# Patient Record
Sex: Male | Born: 1965 | Race: White | Hispanic: No | Marital: Married | Smoking: Former smoker
Health system: Southern US, Community
[De-identification: ages and names within clinical notes are randomized; demographics above are authoritative.]

## PROBLEM LIST (undated history)

## (undated) DIAGNOSIS — I10 Essential (primary) hypertension: Secondary | ICD-10-CM

## (undated) DIAGNOSIS — E7800 Pure hypercholesterolemia, unspecified: Secondary | ICD-10-CM

## (undated) HISTORY — PX: APPENDECTOMY: SHX54

## (undated) HISTORY — PX: KNEE ARTHROSCOPY: SUR90

---

## 2003-02-25 ENCOUNTER — Ambulatory Visit (HOSPITAL_BASED_OUTPATIENT_CLINIC_OR_DEPARTMENT_OTHER): Admission: RE | Admit: 2003-02-25 | Discharge: 2003-02-25 | Payer: Self-pay

## 2015-05-30 ENCOUNTER — Other Ambulatory Visit: Payer: Self-pay | Admitting: Internal Medicine

## 2015-05-30 ENCOUNTER — Inpatient Hospital Stay
Admission: RE | Admit: 2015-05-30 | Discharge: 2015-05-30 | Disposition: A | Discharge status: Home / Self Care | Payer: Self-pay | Source: Ambulatory Visit | Attending: Internal Medicine | Admitting: Internal Medicine

## 2015-05-30 DIAGNOSIS — R109 Unspecified abdominal pain: Secondary | ICD-10-CM

## 2016-04-04 DIAGNOSIS — E784 Other hyperlipidemia: Secondary | ICD-10-CM | POA: Diagnosis not present

## 2016-04-04 DIAGNOSIS — Z125 Encounter for screening for malignant neoplasm of prostate: Secondary | ICD-10-CM | POA: Diagnosis not present

## 2016-04-04 DIAGNOSIS — R7301 Impaired fasting glucose: Secondary | ICD-10-CM | POA: Diagnosis not present

## 2016-04-04 DIAGNOSIS — I10 Essential (primary) hypertension: Secondary | ICD-10-CM | POA: Diagnosis not present

## 2016-04-11 DIAGNOSIS — K219 Gastro-esophageal reflux disease without esophagitis: Secondary | ICD-10-CM | POA: Diagnosis not present

## 2016-04-11 DIAGNOSIS — Z0000 Encounter for general adult medical examination without abnormal findings: Secondary | ICD-10-CM | POA: Diagnosis not present

## 2016-04-11 DIAGNOSIS — E784 Other hyperlipidemia: Secondary | ICD-10-CM | POA: Diagnosis not present

## 2016-04-11 DIAGNOSIS — R7301 Impaired fasting glucose: Secondary | ICD-10-CM | POA: Diagnosis not present

## 2016-04-11 DIAGNOSIS — Z23 Encounter for immunization: Secondary | ICD-10-CM | POA: Diagnosis not present

## 2016-04-11 DIAGNOSIS — Z1389 Encounter for screening for other disorder: Secondary | ICD-10-CM | POA: Diagnosis not present

## 2016-04-11 DIAGNOSIS — J309 Allergic rhinitis, unspecified: Secondary | ICD-10-CM | POA: Diagnosis not present

## 2016-05-14 DIAGNOSIS — J0100 Acute maxillary sinusitis, unspecified: Secondary | ICD-10-CM | POA: Diagnosis not present

## 2016-06-25 ENCOUNTER — Encounter (HOSPITAL_COMMUNITY): Payer: Self-pay

## 2016-06-25 ENCOUNTER — Emergency Department (HOSPITAL_COMMUNITY): Payer: BLUE CROSS/BLUE SHIELD

## 2016-06-25 DIAGNOSIS — R002 Palpitations: Secondary | ICD-10-CM | POA: Diagnosis not present

## 2016-06-25 DIAGNOSIS — R55 Syncope and collapse: Secondary | ICD-10-CM | POA: Diagnosis not present

## 2016-06-25 DIAGNOSIS — Z87891 Personal history of nicotine dependence: Secondary | ICD-10-CM | POA: Insufficient documentation

## 2016-06-25 DIAGNOSIS — I10 Essential (primary) hypertension: Secondary | ICD-10-CM | POA: Insufficient documentation

## 2016-06-25 DIAGNOSIS — R0602 Shortness of breath: Secondary | ICD-10-CM | POA: Diagnosis not present

## 2016-06-25 LAB — BASIC METABOLIC PANEL
Anion gap: 14 (ref 5–15)
BUN: 13 mg/dL (ref 6–20)
CO2: 22 mmol/L (ref 22–32)
Calcium: 9.5 mg/dL (ref 8.9–10.3)
Chloride: 104 mmol/L (ref 101–111)
Creatinine, Ser: 0.95 mg/dL (ref 0.61–1.24)
GFR calc Af Amer: >60 mL/min (ref >60)
GLUCOSE: 116 mg/dL — AB (ref 65–99)
POTASSIUM: 4.3 mmol/L (ref 3.5–5.1)
Sodium: 140 mmol/L (ref 135–145)

## 2016-06-25 LAB — CBC
HEMATOCRIT: 44.1 % (ref 39.0–52.0)
Hemoglobin: 15.3 g/dL (ref 13.0–17.0)
MCH: 30.2 pg (ref 26.0–34.0)
MCHC: 34.7 g/dL (ref 30.0–36.0)
MCV: 87 fL (ref 78.0–100.0)
Platelets: 227 10*3/uL (ref 150–400)
RBC: 5.07 MIL/uL (ref 4.22–5.81)
RDW: 13.5 % (ref 11.5–15.5)
WBC: 12.7 10*3/uL — ABNORMAL HIGH (ref 4.0–10.5)

## 2016-06-25 LAB — I-STAT TROPONIN, ED: Troponin i, poc: 0 ng/mL (ref 0.00–0.08)

## 2016-06-25 NOTE — ED Triage Notes (Signed)
Onset 4 days ago pt had episode of chest pain, dizziness, and nausea.  Has happened 4 times since onset.  Symptoms tonight lasted longer than other times.  No chest pain or dizziness at this time.  Does have nausea.

## 2016-06-26 ENCOUNTER — Emergency Department (HOSPITAL_COMMUNITY)
Admission: EM | Admit: 2016-06-26 | Discharge: 2016-06-26 | Disposition: A | Discharge status: Home / Self Care | Payer: BLUE CROSS/BLUE SHIELD | Attending: Emergency Medicine | Admitting: Emergency Medicine

## 2016-06-26 DIAGNOSIS — R002 Palpitations: Secondary | ICD-10-CM

## 2016-06-26 DIAGNOSIS — R55 Syncope and collapse: Secondary | ICD-10-CM

## 2016-06-26 HISTORY — DX: Essential (primary) hypertension: I10

## 2016-06-26 HISTORY — DX: Pure hypercholesterolemia, unspecified: E78.00

## 2016-06-26 LAB — I-STAT TROPONIN, ED: TROPONIN I, POC: 0 ng/mL (ref 0.00–0.08)

## 2016-06-26 NOTE — Discharge Instructions (Signed)
You were seen today for palpitations and episodes of feeling like you're going pass out. Your workup is reassuring. You need to follow-up closely with cardiology. You likely need a Holter monitor and/or further cardiology workup. If your symptoms worsen, you pass out, you have chest pain or any new or worsening symptoms she needs to be reevaluated.

## 2016-06-26 NOTE — ED Provider Notes (Signed)
[facility]-EMERGENCY DEPT Provider Note   CSN: [id number] Arrival date & time: 06/25/16  2144     History   Chief Complaint Chief Complaint  Patient presents with  . Chest Pain    HPI Riley Rojas is a 51 y.o. male.  HPI  This a 51 year old male who presents with palpitations. Patient reports one-week history of intermittent palpitations.  He has 1-2 episodes per day and lasts for 5-10 minutes. Denies chest pain or shortness of breath during these episodes. He does state that he feels nauseous after the fact and feels like he might pass out. Denies any recent changes in diet or increased caffeine intake. No history of heart disease. No prior history. Denies drug use. Currently is asymptomatic.  Past Medical History:  Diagnosis Date  . Hypercholesteremia   . Hypertension     There are no active problems to display for this patient.   Past Surgical History:  Procedure Laterality Date  . APPENDECTOMY    . KNEE ARTHROSCOPY         Home Medications    Prior to Admission medications   Medication Sig Start Date End Date Taking? Authorizing Provider  hydrochlorothiazide (MICROZIDE) 12.5 MG capsule Take 12.5 mg by mouth daily.   Yes Historical Provider, MD  loratadine (CLARITIN) 10 MG tablet Take 10 mg by mouth daily.   Yes Historical Provider, MD  Multiple Vitamin (MULTIVITAMIN WITH MINERALS) TABS tablet Take 1 tablet by mouth daily.   Yes Historical Provider, MD  RAMIPRIL PO Take 1 capsule by mouth daily.   Yes Historical Provider, MD  SIMVASTATIN PO Take 1 tablet by mouth daily.   Yes Historical Provider, MD    Family History History reviewed. No pertinent family history.  Social History Social History  Substance Use Topics  . Smoking status: Former Games developermoker  . Smokeless tobacco: Never Used  . Alcohol use No     Allergies   Patient has no known allergies.   Review of Systems Review of Systems  Constitutional: Negative for fever.  Respiratory: Negative for  shortness of breath.   Cardiovascular: Positive for palpitations. Negative for chest pain and leg swelling.  Gastrointestinal: Positive for nausea. Negative for abdominal pain, diarrhea and vomiting.  Neurological:       Near syncope  All other systems reviewed and are negative.    Physical Exam Updated Vital Signs BP 137/98   Pulse 81   Temp 97.7 F (36.5 C) (Oral)   Resp 13   SpO2 98%   Physical Exam  Constitutional: He is oriented to person, place, and time. He appears well-developed and well-nourished. No distress.  HENT:  Head: Normocephalic and atraumatic.  Cardiovascular: Normal rate, regular rhythm and normal heart sounds.   No murmur heard. Pulmonary/Chest: Effort normal and breath sounds normal. No respiratory distress. He has no wheezes.  Abdominal: Soft. Bowel sounds are normal. There is no tenderness. There is no rebound.  Musculoskeletal: He exhibits no edema.  Neurological: He is alert and oriented to person, place, and time.  Skin: Skin is warm and dry.  Psychiatric: He has a normal mood and affect.  Nursing note and vitals reviewed.    ED Treatments / Results  Labs (all labs ordered are listed, but only abnormal results are displayed) Labs Reviewed  BASIC METABOLIC PANEL - Abnormal; Notable for the following:       Result Value   Glucose, Bld 116 (*)    All other components within normal limits  CBC -  Abnormal; Notable for the following:    WBC 12.7 (*)    All other components within normal limits  I-STAT TROPOININ, ED  I-STAT TROPOININ, ED    EKG  EKG Interpretation  Date/Time:  Tuesday June 25 2016 21:48:28 EST Ventricular Rate:  107 PR Interval:  172 QRS Duration: 74 QT Interval:  316 QTC Calculation: 421 R Axis:   -51 Text Interpretation:  Sinus tachycardia Left axis deviation Low voltage QRS Cannot rule out Anterior infarct , age undetermined Abnormal ECG No prior Confirmed by Wilkie Aye  MD, Daleah Coulson ([id number]) on 06/26/2016 5:20:09 AM         Radiology Dg Chest 2 View  Result Date: 06/25/2016 CLINICAL DATA:  Initial evaluation for acute palpitations for 5 days. Chest discomfort with worsening shortness of breath. EXAM: CHEST  2 VIEW COMPARISON:  None. FINDINGS: The cardiac and mediastinal silhouettes are within normal limits. The lungs are normally inflated. Subsegmental atelectasis at the left lung base. No airspace consolidation, pleural effusion, or pulmonary edema is identified. There is no pneumothorax. No acute osseous abnormality identified. IMPRESSION: Mild left basilar subsegmental atelectasis. No other active cardiopulmonary disease identified. Electronically Signed   By: Rise Mu M.D.   On: 06/25/2016 22:33    Procedures Procedures (including critical care time)  Medications Ordered in ED Medications - No data to display   Initial Impression / Assessment and Plan / ED Course  I have reviewed the triage vital signs and the nursing notes.  Pertinent labs & imaging results that were available during my care of the patient were reviewed by me and considered in my medical decision making (see chart for details).  Clinical Course     Patient presents with episodes of palpitations. Has been in the waiting room for several hours without any symptoms. Currently asymptomatic. Initial EKG sinus tachycardia. Denies chest pain or shortness of breath. On my evaluation heart rate is in the 80s. It appears to be in sinus. No PVCs. He is not orthostatic. Basic lab work is largely reassuring. Troponin 2 is negative. Patient describes discreet episodes of palpitations and near syncope. He likely needs to be evaluated with a Holter monitor in the ambulatory setting. He will need follow-up closely with cardiology. Given that he has been asymptomatic and has had no events while on monitoring in the ER, feel he can be evaluated as an outpatient. Discussed this with he and his wife. Follow-up with cardiology.  After history,  exam, and medical workup I feel the patient has been appropriately medically screened and is safe for discharge home. Pertinent diagnoses were discussed with the patient. Patient was given return precautions.   Final Clinical Impressions(s) / ED Diagnoses   Final diagnoses:  Palpitations  Near syncope    New Prescriptions New Prescriptions   No medications on file     Shon Baton, MD 06/26/16 [phone]

## 2016-06-27 DIAGNOSIS — K219 Gastro-esophageal reflux disease without esophagitis: Secondary | ICD-10-CM | POA: Diagnosis not present

## 2016-06-27 DIAGNOSIS — R002 Palpitations: Secondary | ICD-10-CM | POA: Diagnosis not present

## 2016-06-27 DIAGNOSIS — I10 Essential (primary) hypertension: Secondary | ICD-10-CM | POA: Diagnosis not present

## 2016-06-27 DIAGNOSIS — E784 Other hyperlipidemia: Secondary | ICD-10-CM | POA: Diagnosis not present

## 2016-06-28 ENCOUNTER — Telehealth: Payer: Self-pay | Admitting: *Deleted

## 2016-07-01 ENCOUNTER — Other Ambulatory Visit: Payer: Self-pay

## 2016-07-01 ENCOUNTER — Ambulatory Visit (HOSPITAL_COMMUNITY): Payer: BLUE CROSS/BLUE SHIELD | Attending: Cardiology

## 2016-07-01 ENCOUNTER — Other Ambulatory Visit: Payer: Self-pay | Admitting: Internal Medicine

## 2016-07-01 DIAGNOSIS — R002 Palpitations: Secondary | ICD-10-CM | POA: Diagnosis not present

## 2016-07-01 DIAGNOSIS — I10 Essential (primary) hypertension: Secondary | ICD-10-CM | POA: Insufficient documentation

## 2016-07-01 NOTE — Telephone Encounter (Signed)
Appointment scheduled.

## 2016-07-02 ENCOUNTER — Other Ambulatory Visit: Payer: Self-pay | Admitting: Internal Medicine

## 2016-07-02 ENCOUNTER — Ambulatory Visit (INDEPENDENT_AMBULATORY_CARE_PROVIDER_SITE_OTHER): Payer: BLUE CROSS/BLUE SHIELD

## 2016-07-02 DIAGNOSIS — R002 Palpitations: Secondary | ICD-10-CM

## 2016-07-02 DIAGNOSIS — R55 Syncope and collapse: Secondary | ICD-10-CM

## 2016-07-02 DIAGNOSIS — I10 Essential (primary) hypertension: Secondary | ICD-10-CM

## 2016-07-09 ENCOUNTER — Ambulatory Visit (INDEPENDENT_AMBULATORY_CARE_PROVIDER_SITE_OTHER): Payer: BLUE CROSS/BLUE SHIELD | Admitting: Physician Assistant

## 2016-07-09 ENCOUNTER — Encounter (INDEPENDENT_AMBULATORY_CARE_PROVIDER_SITE_OTHER): Payer: Self-pay

## 2016-07-09 ENCOUNTER — Encounter: Payer: Self-pay | Admitting: Physician Assistant

## 2016-07-09 VITALS — BP 140/92 | HR 92 | Ht 68.0 in | Wt 206.0 lb

## 2016-07-09 DIAGNOSIS — Z8249 Family history of ischemic heart disease and other diseases of the circulatory system: Secondary | ICD-10-CM | POA: Diagnosis not present

## 2016-07-09 DIAGNOSIS — E785 Hyperlipidemia, unspecified: Secondary | ICD-10-CM

## 2016-07-09 DIAGNOSIS — I10 Essential (primary) hypertension: Secondary | ICD-10-CM | POA: Insufficient documentation

## 2016-07-09 DIAGNOSIS — I493 Ventricular premature depolarization: Secondary | ICD-10-CM | POA: Insufficient documentation

## 2016-07-09 MED ORDER — METOPROLOL SUCCINATE ER 25 MG PO TB24: 25.0000 mg | ORAL_TABLET | Freq: Every day | ORAL | 6 refills | Status: DC

## 2016-07-09 NOTE — Progress Notes (Signed)
Cardiology Office Note    Date:  07/09/2016   ID:  Riley Rojas, DOB 11/17/1965, MRN [id number]  PCP:  Hoyle Sauer, MD  Cardiologist:   No chief complaint on file.   History of Present Illness:  Riley Rojas is a 51 y.o. male  with no prior cardiac history who is referred to [place] from the emergency room. He was in the ER 06/26/16 with palpitations lasting 5-10 minutes. He had no associated chest pain or shortness of breath. He felt nauseous after the fact and felt like he might pass out. Has a history of hypertension and hypercholesterolemia. Initial EKG showed sinus tachycardia. He was monitored for several hours without arrhythmias. He was not orthostatic. Troponins were negative 2.  Holter monitor showed occasional PVCs and rare bigeminy, and no positives or arrhythmias. Symptoms correlated with PVCs. 2-D echo was with normal LV function grade 1 DD no significant valvular abnormality.  Complains of fluttering in his chest associated with dizziness, racing, fatigue and nausea. Some worse than others. Occurs once or twice a day but last Wed while working 3rd shift it lasted all night. While wearing the monitor he only had 3 light episodes. Was drinking 8-10 glasses of tea daily now down to 5-6 glasses/day. Usually gets 3-4 hrs sleep.    Past Medical History:  Diagnosis Date  . Hypercholesteremia   . Hypertension     Past Surgical History:  Procedure Laterality Date  . APPENDECTOMY    . KNEE ARTHROSCOPY      Current Medications: Outpatient Medications Prior to Visit  Medication Sig Dispense Refill  . hydrochlorothiazide (MICROZIDE) 12.5 MG capsule Take 12.5 mg by mouth daily.    Marland Kitchen loratadine (CLARITIN) 10 MG tablet Take 10 mg by mouth daily.    . Multiple Vitamin (MULTIVITAMIN WITH MINERALS) TABS tablet Take 1 tablet by mouth daily.    . ramipril (ALTACE) 10 MG capsule Take 1 capsule by mouth daily.    . simvastatin (ZOCOR) 20 MG tablet Take 1 tablet by mouth daily.      No facility-administered medications prior to visit.      Allergies:   Patient has no known allergies.   Social History   Social History  . Marital status: Married    Spouse name: N/A  . Number of children: N/A  . Years of education: N/A   Social History Main Topics  . Smoking status: Former Games developer  . Smokeless tobacco: Never Used  . Alcohol use No  . Drug use: No  . Sexual activity: Not Asked   Other Topics Concern  . None   Social History Narrative  . None     Family History:  The patient's family history includes Heart attack (age of onset: 87) in his mother; Heart disease in his mother.   ROS:   Please see the history of present illness.    Review of Systems  Constitution: Negative.  HENT: Negative.   Cardiovascular: Negative.   Respiratory: Negative.   Endocrine: Negative.   Hematologic/Lymphatic: Negative.   Musculoskeletal: Negative.   Gastrointestinal: Negative.   Genitourinary: Negative.   Neurological: Negative.    All other systems reviewed and are negative.   PHYSICAL EXAM:   VS:  BP (!) 140/92 Comment: left arm  Pulse 92   Ht 5\' 8"  (1.727 m)   Wt 206 lb (93.4 kg)   BMI 31.32 kg/m   Physical Exam  GEN: Well nourished, well developed, in no acute distress  HEENT: normal  Neck: no JVD, carotid bruits, or masses Cardiac:RRR; no murmurs, rubs, or gallops  Respiratory:  clear to auscultation bilaterally, normal work of breathing GI: soft, nontender, nondistended, + BS Ext: without cyanosis, clubbing, or edema, Good distal pulses bilaterally MS: no deformity or atrophy  Skin: warm and dry, no rash Neuro:  Alert and Oriented x 3, Strength and sensation are intact Psych: euthymic mood, full affect  Wt Readings from Last 3 Encounters:  07/09/16 206 lb (93.4 kg)      Studies/Labs Reviewed:   EKG:  EKG is Not ordered today.  EKG reviewed from ER 06/26/16 shows sinus tachycardia at 107 bpm with poor R wave progression anteriorly. No prior  EKGs to compare   Recent Labs: 06/25/2016: BUN 13; Creatinine, Ser 0.95; Hemoglobin 15.3; Platelets 227; Potassium 4.3; Sodium 140   Lipid Panel No results found for: CHOL, TRIG, HDL, CHOLHDL, VLDL, LDLCALC, LDLDIRECT  Additional studies/ records that were reviewed today include:   24-hour Holter 07/06/16  Occasional PVC's (438) with rare bigeminy (52 total beats)  Rare PAC's (4)  No pauses > 3 seconds  No atrial fibrillation or ventricular tachycardia  Symptoms (flutter) occasionally correlate with PVC's  No adverse arrhythmias   2-D echo 06/26/16 Study Conclusions   - Left ventricle: The cavity size was normal. Wall thickness was   normal. Systolic function was normal. The estimated ejection   fraction was in the range of 55% to 60%. Wall motion was normal;   there were no regional wall motion abnormalities. Doppler   parameters are consistent with abnormal left ventricular   relaxation (grade 1 diastolic dysfunction). - Aortic valve: There was no stenosis. - Mitral valve: There was no significant regurgitation. - Right ventricle: The cavity size was normal. Systolic function   was normal. - Pulmonary arteries: No complete TR doppler jet so unable to   estimate PA systolic pressure. - Inferior vena cava: The vessel was normal in size. The   respirophasic diameter changes were in the normal range (>= 50%),   consistent with normal central venous pressure.   Impressions:   - Normal LV size and systolic function, EF 55-60%. Normal RV size   and systolic function. No significant valvular abnormalities.     ASSESSMENT:    1. PVC (premature ventricular contraction)   2. Benign essential HTN   3. Hyperlipidemia, unspecified hyperlipidemia type   4. Family history of early CAD      PLAN:  In order of problems listed above:  PVCs with symptoms of palpitations correlated on 24-hour monitor. Recommend dramatically cut back on his caffeine and increase his sleep. We'll  check TSH and magnesium. Stop HCTZ and begin Toprol XL 25 mg once daily  Essential hypertension controlled on ramipril  Hyperlipidemia on simvastatin managed by primary care  Family history of early CAD with his mother having MIs in her 69s. With no symptoms of angina Dr. Ladona Ridgel did not feel like patient is stress test at this time. Follow-up with M.D. to become established in 2 months.    Medication Adjustments/Labs and Tests Ordered: Current medicines are reviewed at length with the patient today.  Concerns regarding medicines are outlined above.  Medication changes, Labs and Tests ordered today are listed in the Patient Instructions below. Patient Instructions    Your physician has recommended you make the following change in your medication:   1.) STOP the HCTZ.  2.) start new prescription given today for metoprolol succ 25 mg daily. This has been sent  to your pharmacy on file.  Herma CarsonMichelle Lenze recommends that you decrease your caffeine intake. Caffeine can cause increased palpitations if consumed frequently.  Your physician recommends that you return for lab work in: TODAY.  Your physician recommends that you schedule a follow-up appointment in: 2  Months with first available MD to follow up and establish care.       Elson ClanSigned, Michele Lenze, PA-C  07/09/2016 9:31 AM    [facility] [address], [place], [place]  [address] Phone: [phone]) [phone]; Fax: [phone]

## 2016-07-09 NOTE — Patient Instructions (Addendum)
   Your physician has recommended you make the following change in your medication:   1.) STOP the HCTZ.  2.) start new prescription given today for metoprolol succ 25 mg daily. This has been sent to your pharmacy on file.  Herma CarsonMichelle Lenze recommends that you decrease your caffeine intake. Caffeine can cause increased palpitations if consumed frequently.  Your physician recommends that you return for lab work in: TODAY.  Your physician recommends that you schedule a follow-up appointment in: 2  Months with first available MD to follow up and establish care.

## 2016-07-10 LAB — MAGNESIUM: MAGNESIUM: 2.3 mg/dL (ref 1.6–2.3)

## 2016-07-10 LAB — TSH: TSH: 3.71 u[IU]/mL (ref 0.450–4.500)

## 2016-07-12 ENCOUNTER — Telehealth: Payer: Self-pay | Admitting: Cardiology

## 2016-07-12 NOTE — Telephone Encounter (Signed)
Pt has been advised his thyroid panel results and he verbalized understanding.

## 2016-07-12 NOTE — Telephone Encounter (Signed)
Mr. Ophelia CharterYates is returning a call about some lab results. Please call .Marland Kitchen. Thanks

## 2016-07-12 NOTE — Telephone Encounter (Signed)
-----   Message from Dyann KiefMichele M Lenze, PA-C sent at 07/12/2016 11:27 AM EST ----- Normal thyroid

## 2016-08-29 ENCOUNTER — Telehealth: Payer: Self-pay | Admitting: Cardiology

## 2016-08-29 MED ORDER — METOPROLOL SUCCINATE ER 50 MG PO TB24: 50.0000 mg | ORAL_TABLET | Freq: Every day | ORAL | Status: DC

## 2016-08-29 NOTE — Telephone Encounter (Signed)
New Message  Pt c/o BP issue: STAT if pt c/o blurred vision, one-sided weakness or slurred speech  1. What are your last 5 BP readings? 16/110 last night  2. Are you having any other symptoms (ex. Dizziness, headache, blurred vision, passed out)? Pts wife voiced he can't explain the symptoms per pts wife.  3. What is your BP issue? Pt has not been feeling well and wants to be seen sooner if possible.

## 2016-08-29 NOTE — Telephone Encounter (Signed)
Pt is aware to increase his Metoprolol to 50 mg a day.  He will c/b if no improvement.  He will keep appt as scheduled for next Friday.

## 2016-08-29 NOTE — Telephone Encounter (Signed)
Left increase his metoprolol extended release to 50 mg daily. Donato SchultzMark Skains, MD

## 2016-08-29 NOTE — Telephone Encounter (Signed)
Spoke with pt RE: recent BPs  Pt reports his BP has been running between140-150/90-100 with HR of 85 to 97 bpm.  He has been having a funning feeling in his head, kind of like a pressure.  He is on Ramipril 10 mg and Metoprolol 25 mg daily.  He denies the use of any decongestants.  He has an appt 3/16 with Dr Anne FuSkains but would like to know if he should change medication or dosage prior to that appt for better control of his BP.  Advised I will forward information to Dr Anne FuSkains for review and c/b with orders/recommenations.  Pt states understanding.

## 2016-08-30 ENCOUNTER — Encounter: Payer: Self-pay | Admitting: *Deleted

## 2016-09-06 ENCOUNTER — Encounter: Payer: Self-pay | Admitting: Cardiology

## 2016-09-06 ENCOUNTER — Ambulatory Visit (INDEPENDENT_AMBULATORY_CARE_PROVIDER_SITE_OTHER): Payer: BLUE CROSS/BLUE SHIELD | Admitting: Cardiology

## 2016-09-06 ENCOUNTER — Encounter (INDEPENDENT_AMBULATORY_CARE_PROVIDER_SITE_OTHER): Payer: Self-pay

## 2016-09-06 VITALS — BP 122/90 | HR 78 | Ht 68.0 in | Wt 201.0 lb

## 2016-09-06 DIAGNOSIS — I10 Essential (primary) hypertension: Secondary | ICD-10-CM

## 2016-09-06 DIAGNOSIS — I493 Ventricular premature depolarization: Secondary | ICD-10-CM | POA: Diagnosis not present

## 2016-09-06 DIAGNOSIS — E7800 Pure hypercholesterolemia, unspecified: Secondary | ICD-10-CM | POA: Diagnosis not present

## 2016-09-06 MED ORDER — METOPROLOL SUCCINATE ER 50 MG PO TB24: 50.0000 mg | ORAL_TABLET | Freq: Every day | ORAL | 11 refills | Status: DC

## 2016-09-06 NOTE — Patient Instructions (Signed)
Medication Instructions:  The current medical regimen is effective;  continue present plan and medications.  Follow-Up: Follow up as needed with Dr Skains.  Thank you for choosing [facility]!!     

## 2016-09-06 NOTE — Progress Notes (Signed)
Cardiology Office Note    Date:  09/06/2016   ID:  Riley Rojas, DOB [birth year], MRN [id number]  PCP:  Hoyle Sauer, MD  Cardiologist:  Donato Schultz, MD     History of Present Illness:  Riley Rojas is a 51 y.o. male previously seen by Riley Bers, PA in 07/09/16 for palpitations and near syncope associated with nausea.  Fluttering in chest, once or twice a day usually. High caffeine use. Funny feeling in head.   Felt more frequent palps, so we increased metoprolol to 50 QD after phone call on 08/29/16. BP was elevated as well at the time of increase. Since doing that, BP still seemed high. But today 122/90.  At home 145-165. Ramipril, (off HCTZ, now on metoprol)  No CP, no SOB, no dyspnea.  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  Past Medical History:  Diagnosis Date  . Hypercholesteremia   . Hypertension     Past Surgical History:  Procedure Laterality Date  . APPENDECTOMY    . KNEE ARTHROSCOPY      Current Medications: Outpatient Medications Prior to Visit  Medication Sig Dispense Refill  . hydrochlorothiazide (MICROZIDE) 12.5 MG capsule Take 12.5 mg by mouth daily.    Marland Kitchen loratadine (CLARITIN) 10 MG tablet Take 10 mg by mouth daily.    . Multiple Vitamin (MULTIVITAMIN WITH MINERALS) TABS tablet Take 1 tablet by mouth daily.    . ramipril (ALTACE) 10 MG capsule Take 1 capsule by mouth daily.    . simvastatin (ZOCOR) 20 MG tablet Take 1 tablet by mouth daily.    . metoprolol succinate (TOPROL-XL) 50 MG 24 hr tablet Take 1 tablet (50 mg total) by mouth daily.     No facility-administered medications prior to visit.      Allergies:   Patient has no known allergies.   Social History   Social History  . Marital status: Married    Spouse name: N/A  . Number of children: N/A  . Years of education: N/A   Social History Main Topics  . Smoking status: Former Games developer  . Smokeless tobacco: Never  Used  . Alcohol use No  . Drug use: No  . Sexual activity: Not Asked   Other Topics Concern  . None   Social History Narrative  . None     Family History:  The patient's family history includes Heart attack (age of onset: 73) in his mother; Heart disease in his mother.   ROS:   Please see the history of present illness.    ROS All other systems reviewed and are negative.   PHYSICAL EXAM:   VS:  BP 122/90   Pulse 78   Ht 5\' 8"  (1.727 m)   Wt 201 lb (91.2 kg)   SpO2 98%   BMI 30.56 kg/m    GEN: Well nourished, well developed, in no acute distress  HEENT: normal  Neck: no JVD, carotid bruits, or masses Cardiac: RRR; no murmurs, rubs, or gallops,no edema  Respiratory:  clear to auscultation bilaterally, normal work of breathing GI: soft, nontender, nondistended, + BS MS: no deformity or atrophy  Skin: warm and dry, no rash Neuro:  Alert and Oriented x 3, Strength and sensation are intact Psych: euthymic mood, full affect  Wt Readings from Last 3 Encounters:  09/06/16 201 lb (91.2 kg)  07/09/16 206 lb (93.4 kg)      Studies/Labs Reviewed:  EKG:  06/26/16 - sinus tach 106, no other changes, personally viewed.   Recent Labs: 06/25/2016: BUN 13; Creatinine, Ser 0.95; Hemoglobin 15.3; Platelets 227; Potassium 4.3; Sodium 140 07/09/2016: Magnesium 2.3; TSH 3.710   Lipid Panel No results found for: CHOL, TRIG, HDL, CHOLHDL, VLDL, LDLCALC, LDLDIRECT  Additional studies/ records that were reviewed today include:   24-hour Holter 07/06/16  Occasional PVC's (438) with rare bigeminy (52 total beats)  Rare PAC's (4)  No pauses > 3 seconds  No atrial fibrillation or ventricular tachycardia  Symptoms (flutter) occasionally correlate with PVC's  No adverse arrhythmias  2-D echo 06/26/16 Study Conclusions  - Left ventricle: The cavity size was normal. Wall thickness was normal. Systolic function was normal. The estimated ejection fraction was in the range of 55%  to 60%. Wall motion was normal; there were no regional wall motion abnormalities. Doppler parameters are consistent with abnormal left ventricular relaxation (grade 1 diastolic dysfunction). - Aortic valve: There was no stenosis. - Mitral valve: There was no significant regurgitation. - Right ventricle: The cavity size was normal. Systolic function was normal. - Pulmonary arteries: No complete TR doppler jet so unable to estimate PA systolic pressure. - Inferior vena cava: The vessel was normal in size. The respirophasic diameter changes were in the normal range (>= 50%), consistent with normal central venous pressure.  Impressions:  - Normal LV size and systolic function, EF 55-60%. Normal RV size and systolic function. No significant valvular abnormalities.     ASSESSMENT:    1. PVC (premature ventricular contraction)   2. Benign essential HTN   3. Pure hypercholesterolemia      PLAN:  In order of problems listed above:  Palpitations/PVC's  - recently increased metoprolol. We will prescribed Toprol 50 mg once a day. After this, Dr. Felipa EthAvva can continue prescription  - as seen on holter as above  - reduction in caffeine  - TSH ok  - improve on sleep  Essential hypertension  - meds reviewed. Improved today but at home still somewhat elevated. He will be seen Dr. Felipa EthAvva soon. We are going to resume his HCTZ 12.5 mg once a day and continue his Toprol-XL 50 mg and his Ramipril 10 mg  - Dr. Felipa EthAvva mgt.   Hyperlipidemia  - on simvastatin  - Dr. Felipa EthAvva mgt.   Reassured him with his workup. Normal ejection fraction. He will call us if he needs us.  Medication Adjustments/Labs and Tests Ordered: Current medicines are reviewed at length with the patient today.  Concerns regarding medicines are outlined above.  Medication changes, Labs and Tests ordered today are listed in the Patient Instructions below. Patient Instructions  Medication Instructions:  The  current medical regimen is effective;  continue present plan and medications.  Follow-Up: Follow up as needed with Dr Anne FuSkains.  Thank you for choosing [facility]        Signed, Donato SchultzMark Akima Slaugh, MD  09/06/2016 9:03 AM    [facility] [address], [place], [place]  [address] Phone: [phone]([phone]; Fax: [phone]

## 2016-10-09 DIAGNOSIS — Z1389 Encounter for screening for other disorder: Secondary | ICD-10-CM | POA: Diagnosis not present

## 2016-10-09 DIAGNOSIS — I10 Essential (primary) hypertension: Secondary | ICD-10-CM | POA: Diagnosis not present

## 2016-10-09 DIAGNOSIS — K219 Gastro-esophageal reflux disease without esophagitis: Secondary | ICD-10-CM | POA: Diagnosis not present

## 2016-10-09 DIAGNOSIS — E784 Other hyperlipidemia: Secondary | ICD-10-CM | POA: Diagnosis not present

## 2016-10-09 DIAGNOSIS — R7301 Impaired fasting glucose: Secondary | ICD-10-CM | POA: Diagnosis not present

## 2017-02-03 DIAGNOSIS — M71342 Other bursal cyst, left hand: Secondary | ICD-10-CM | POA: Diagnosis not present

## 2017-02-03 DIAGNOSIS — R7301 Impaired fasting glucose: Secondary | ICD-10-CM | POA: Diagnosis not present

## 2017-02-03 DIAGNOSIS — I10 Essential (primary) hypertension: Secondary | ICD-10-CM | POA: Diagnosis not present

## 2017-02-03 DIAGNOSIS — Z6831 Body mass index (BMI) 31.0-31.9, adult: Secondary | ICD-10-CM | POA: Diagnosis not present

## 2017-05-02 DIAGNOSIS — Z125 Encounter for screening for malignant neoplasm of prostate: Secondary | ICD-10-CM | POA: Diagnosis not present

## 2017-05-02 DIAGNOSIS — R7301 Impaired fasting glucose: Secondary | ICD-10-CM | POA: Diagnosis not present

## 2017-05-02 DIAGNOSIS — I10 Essential (primary) hypertension: Secondary | ICD-10-CM | POA: Diagnosis not present

## 2017-05-02 DIAGNOSIS — Z0000 Encounter for general adult medical examination without abnormal findings: Secondary | ICD-10-CM | POA: Diagnosis not present

## 2017-05-09 DIAGNOSIS — K219 Gastro-esophageal reflux disease without esophagitis: Secondary | ICD-10-CM | POA: Diagnosis not present

## 2017-05-09 DIAGNOSIS — R7301 Impaired fasting glucose: Secondary | ICD-10-CM | POA: Diagnosis not present

## 2017-05-09 DIAGNOSIS — E7849 Other hyperlipidemia: Secondary | ICD-10-CM | POA: Diagnosis not present

## 2017-05-09 DIAGNOSIS — I10 Essential (primary) hypertension: Secondary | ICD-10-CM | POA: Diagnosis not present

## 2017-05-09 DIAGNOSIS — Z1389 Encounter for screening for other disorder: Secondary | ICD-10-CM | POA: Diagnosis not present

## 2017-05-09 DIAGNOSIS — Z0000 Encounter for general adult medical examination without abnormal findings: Secondary | ICD-10-CM | POA: Diagnosis not present

## 2017-05-20 DIAGNOSIS — Z1212 Encounter for screening for malignant neoplasm of rectum: Secondary | ICD-10-CM | POA: Diagnosis not present

## 2017-08-05 DIAGNOSIS — J324 Chronic pansinusitis: Secondary | ICD-10-CM | POA: Diagnosis not present

## 2017-09-22 ENCOUNTER — Other Ambulatory Visit: Payer: Self-pay | Admitting: Cardiology

## 2017-12-06 DIAGNOSIS — B029 Zoster without complications: Secondary | ICD-10-CM | POA: Diagnosis not present

## 2017-12-08 DIAGNOSIS — I10 Essential (primary) hypertension: Secondary | ICD-10-CM | POA: Diagnosis not present

## 2017-12-08 DIAGNOSIS — B0229 Other postherpetic nervous system involvement: Secondary | ICD-10-CM | POA: Diagnosis not present

## 2017-12-08 DIAGNOSIS — Z6832 Body mass index (BMI) 32.0-32.9, adult: Secondary | ICD-10-CM | POA: Diagnosis not present

## 2017-12-14 IMAGING — DX DG CHEST 2V
2 series · 2 of 2 positions shown · non-contrast
Comparison: None.

CLINICAL DATA: Initial evaluation for acute palpitations for 5
days. Chest discomfort with worsening shortness of breath.

EXAM:
CHEST  2 VIEW

[chest pa]
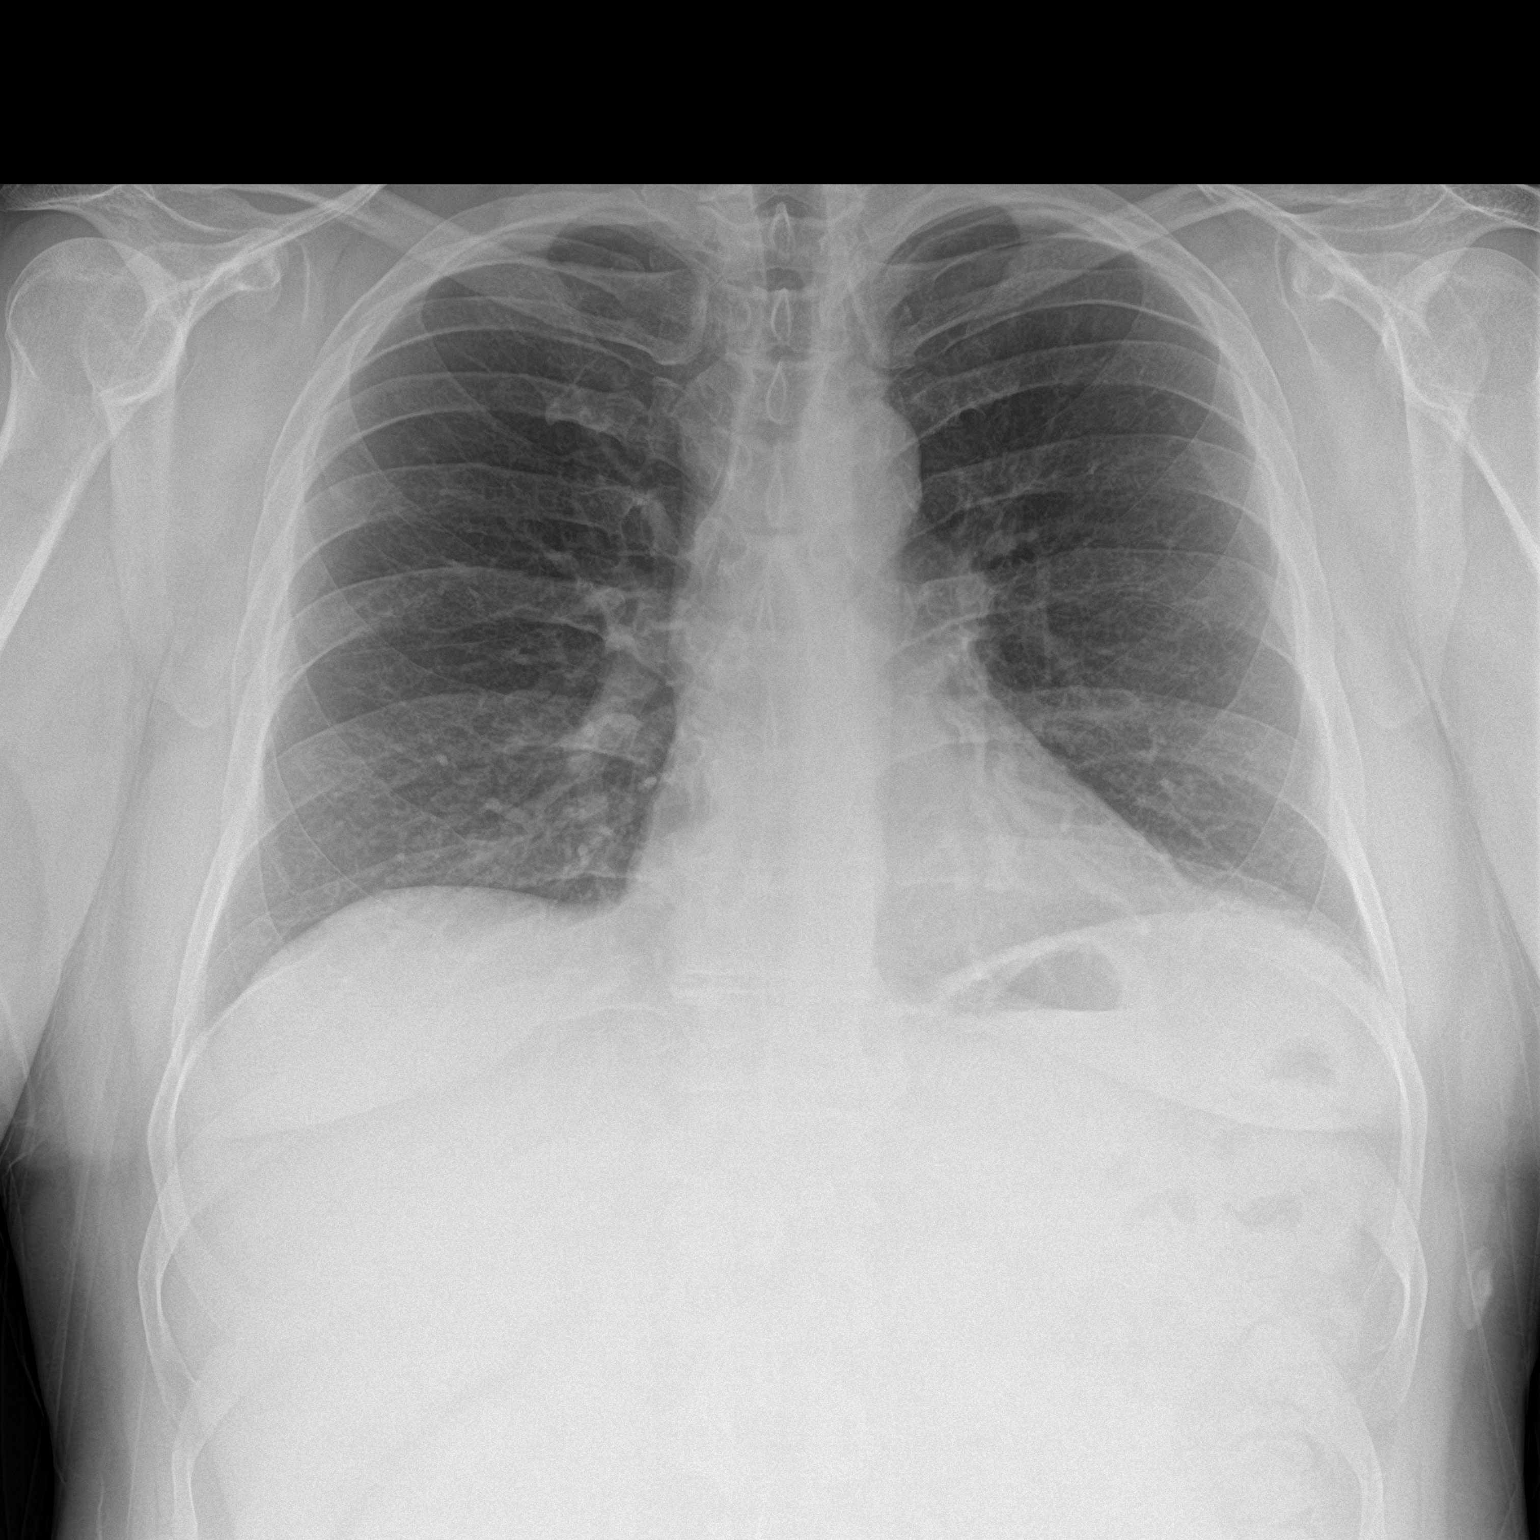

[chest lat]
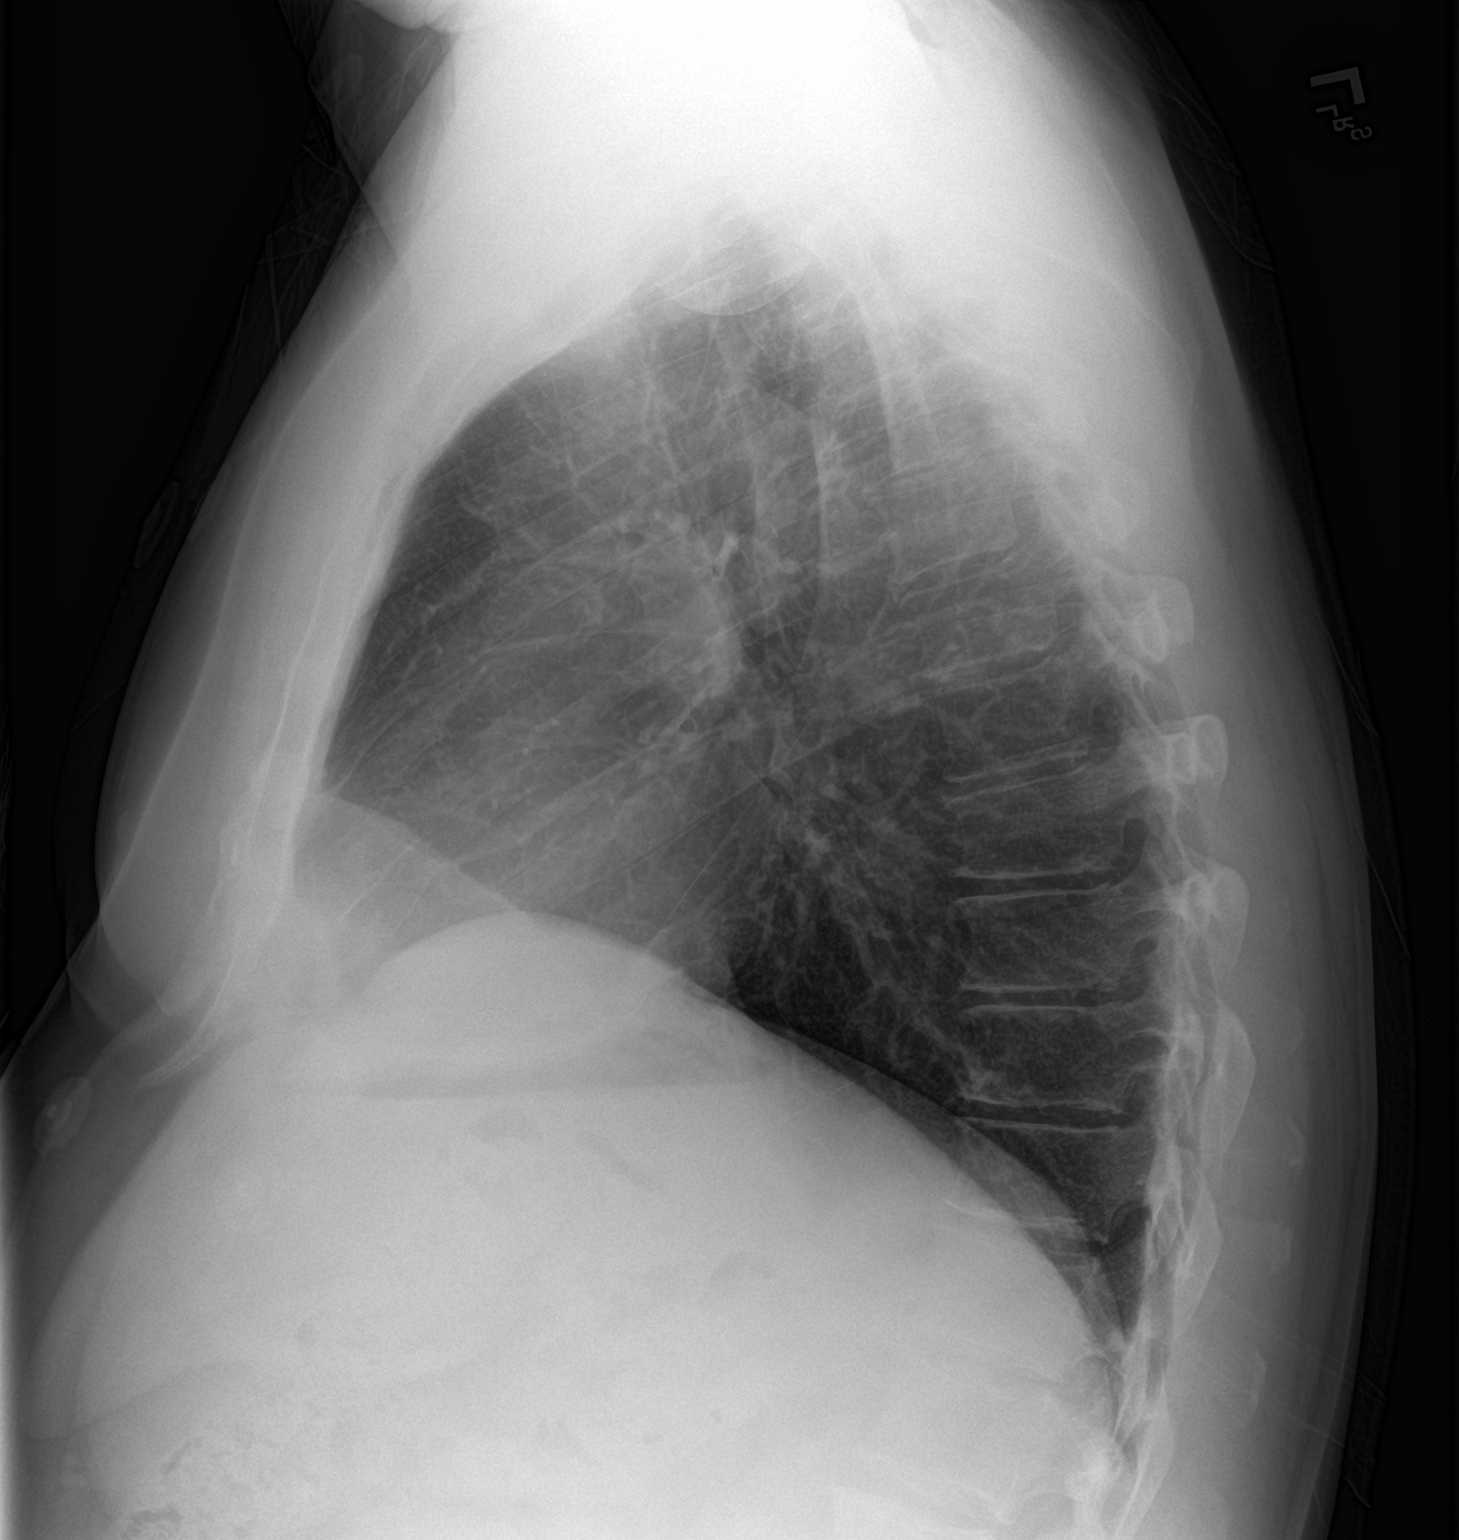

[2 of 2 positions shown; findings below may reference images not displayed]

FINDINGS: The cardiac and mediastinal silhouettes are within normal limits.

The lungs are normally inflated. Subsegmental atelectasis at the
left lung base. No airspace consolidation, pleural effusion, or
pulmonary edema is identified. There is no pneumothorax.

No acute osseous abnormality identified.
IMPRESSION: Mild left basilar subsegmental atelectasis. No other active
cardiopulmonary disease identified.

## 2018-06-02 DIAGNOSIS — I10 Essential (primary) hypertension: Secondary | ICD-10-CM | POA: Diagnosis not present

## 2018-06-02 DIAGNOSIS — Z125 Encounter for screening for malignant neoplasm of prostate: Secondary | ICD-10-CM | POA: Diagnosis not present

## 2018-06-02 DIAGNOSIS — R82998 Other abnormal findings in urine: Secondary | ICD-10-CM | POA: Diagnosis not present

## 2018-06-02 DIAGNOSIS — R7301 Impaired fasting glucose: Secondary | ICD-10-CM | POA: Diagnosis not present

## 2018-06-02 DIAGNOSIS — Z0000 Encounter for general adult medical examination without abnormal findings: Secondary | ICD-10-CM | POA: Diagnosis not present

## 2018-06-09 DIAGNOSIS — Z1212 Encounter for screening for malignant neoplasm of rectum: Secondary | ICD-10-CM | POA: Diagnosis not present

## 2018-06-09 DIAGNOSIS — R7301 Impaired fasting glucose: Secondary | ICD-10-CM | POA: Diagnosis not present

## 2018-06-09 DIAGNOSIS — E7849 Other hyperlipidemia: Secondary | ICD-10-CM | POA: Diagnosis not present

## 2018-06-09 DIAGNOSIS — I10 Essential (primary) hypertension: Secondary | ICD-10-CM | POA: Diagnosis not present

## 2018-06-09 DIAGNOSIS — Z23 Encounter for immunization: Secondary | ICD-10-CM | POA: Diagnosis not present

## 2018-06-09 DIAGNOSIS — K219 Gastro-esophageal reflux disease without esophagitis: Secondary | ICD-10-CM | POA: Diagnosis not present

## 2018-06-09 DIAGNOSIS — Z0000 Encounter for general adult medical examination without abnormal findings: Secondary | ICD-10-CM | POA: Diagnosis not present

## 2018-06-09 DIAGNOSIS — Z1389 Encounter for screening for other disorder: Secondary | ICD-10-CM | POA: Diagnosis not present

## 2018-08-06 DIAGNOSIS — J0100 Acute maxillary sinusitis, unspecified: Secondary | ICD-10-CM | POA: Diagnosis not present

## 2018-08-11 DIAGNOSIS — J0100 Acute maxillary sinusitis, unspecified: Secondary | ICD-10-CM | POA: Diagnosis not present

## 2018-08-11 DIAGNOSIS — J209 Acute bronchitis, unspecified: Secondary | ICD-10-CM | POA: Diagnosis not present

## 2018-12-07 DIAGNOSIS — I10 Essential (primary) hypertension: Secondary | ICD-10-CM | POA: Diagnosis not present

## 2018-12-07 DIAGNOSIS — R7301 Impaired fasting glucose: Secondary | ICD-10-CM | POA: Diagnosis not present

## 2018-12-07 DIAGNOSIS — E785 Hyperlipidemia, unspecified: Secondary | ICD-10-CM | POA: Diagnosis not present

## 2018-12-07 DIAGNOSIS — K219 Gastro-esophageal reflux disease without esophagitis: Secondary | ICD-10-CM | POA: Diagnosis not present

## 2018-12-09 DIAGNOSIS — I10 Essential (primary) hypertension: Secondary | ICD-10-CM | POA: Diagnosis not present

## 2018-12-09 DIAGNOSIS — R7301 Impaired fasting glucose: Secondary | ICD-10-CM | POA: Diagnosis not present

## 2018-12-09 DIAGNOSIS — E7849 Other hyperlipidemia: Secondary | ICD-10-CM | POA: Diagnosis not present

## 2018-12-23 ENCOUNTER — Other Ambulatory Visit: Payer: Self-pay | Admitting: *Deleted

## 2018-12-23 DIAGNOSIS — Z20822 Contact with and (suspected) exposure to covid-19: Secondary | ICD-10-CM

## 2018-12-27 LAB — NOVEL CORONAVIRUS, NAA: SARS-CoV-2, NAA: NOT DETECTED

## 2019-01-27 DIAGNOSIS — R1909 Other intra-abdominal and pelvic swelling, mass and lump: Secondary | ICD-10-CM | POA: Diagnosis not present

## 2019-01-27 DIAGNOSIS — R935 Abnormal findings on diagnostic imaging of other abdominal regions, including retroperitoneum: Secondary | ICD-10-CM | POA: Diagnosis not present

## 2019-01-27 DIAGNOSIS — I861 Scrotal varices: Secondary | ICD-10-CM | POA: Diagnosis not present

## 2019-01-27 DIAGNOSIS — R1032 Left lower quadrant pain: Secondary | ICD-10-CM | POA: Diagnosis not present

## 2019-01-27 DIAGNOSIS — R82998 Other abnormal findings in urine: Secondary | ICD-10-CM | POA: Diagnosis not present

## 2019-01-27 DIAGNOSIS — N5089 Other specified disorders of the male genital organs: Secondary | ICD-10-CM | POA: Diagnosis not present

## 2019-02-01 ENCOUNTER — Other Ambulatory Visit: Payer: Self-pay | Admitting: Internal Medicine

## 2019-02-01 DIAGNOSIS — R1032 Left lower quadrant pain: Secondary | ICD-10-CM

## 2019-02-01 DIAGNOSIS — R1909 Other intra-abdominal and pelvic swelling, mass and lump: Secondary | ICD-10-CM

## 2019-02-02 ENCOUNTER — Other Ambulatory Visit: Payer: Self-pay | Admitting: Internal Medicine

## 2019-02-09 ENCOUNTER — Ambulatory Visit
Admission: RE | Admit: 2019-02-09 | Discharge: 2019-02-09 | Disposition: A | Discharge status: Home / Self Care | Payer: BC Managed Care – PPO | Source: Ambulatory Visit | Attending: Internal Medicine | Admitting: Internal Medicine

## 2019-02-09 DIAGNOSIS — R1032 Left lower quadrant pain: Secondary | ICD-10-CM

## 2019-02-09 DIAGNOSIS — I700 Atherosclerosis of aorta: Secondary | ICD-10-CM | POA: Diagnosis not present

## 2019-02-09 DIAGNOSIS — R1909 Other intra-abdominal and pelvic swelling, mass and lump: Secondary | ICD-10-CM

## 2019-02-09 DIAGNOSIS — N400 Enlarged prostate without lower urinary tract symptoms: Secondary | ICD-10-CM | POA: Diagnosis not present

## 2019-02-09 DIAGNOSIS — R911 Solitary pulmonary nodule: Secondary | ICD-10-CM | POA: Diagnosis not present

## 2019-02-09 MED ORDER — IOPAMIDOL (ISOVUE-300) INJECTION 61%
100.0000 mL | Freq: Once | INTRAVENOUS | Status: AC | PRN
  Administered 2019-02-09: 100 mL via INTRAVENOUS

## 2019-08-09 DIAGNOSIS — Z125 Encounter for screening for malignant neoplasm of prostate: Secondary | ICD-10-CM | POA: Diagnosis not present

## 2019-08-09 DIAGNOSIS — E7849 Other hyperlipidemia: Secondary | ICD-10-CM | POA: Diagnosis not present

## 2019-08-09 DIAGNOSIS — R7301 Impaired fasting glucose: Secondary | ICD-10-CM | POA: Diagnosis not present

## 2019-08-09 DIAGNOSIS — Z0000 Encounter for general adult medical examination without abnormal findings: Secondary | ICD-10-CM | POA: Diagnosis not present

## 2019-08-10 DIAGNOSIS — Z1212 Encounter for screening for malignant neoplasm of rectum: Secondary | ICD-10-CM | POA: Diagnosis not present

## 2019-08-10 DIAGNOSIS — R82998 Other abnormal findings in urine: Secondary | ICD-10-CM | POA: Diagnosis not present

## 2019-08-10 DIAGNOSIS — I10 Essential (primary) hypertension: Secondary | ICD-10-CM | POA: Diagnosis not present

## 2019-08-16 DIAGNOSIS — E785 Hyperlipidemia, unspecified: Secondary | ICD-10-CM | POA: Diagnosis not present

## 2019-08-16 DIAGNOSIS — R7301 Impaired fasting glucose: Secondary | ICD-10-CM | POA: Diagnosis not present

## 2019-08-16 DIAGNOSIS — K219 Gastro-esophageal reflux disease without esophagitis: Secondary | ICD-10-CM | POA: Diagnosis not present

## 2019-08-16 DIAGNOSIS — I10 Essential (primary) hypertension: Secondary | ICD-10-CM | POA: Diagnosis not present

## 2019-08-16 DIAGNOSIS — Z0000 Encounter for general adult medical examination without abnormal findings: Secondary | ICD-10-CM | POA: Diagnosis not present

## 2019-08-16 DIAGNOSIS — Z1331 Encounter for screening for depression: Secondary | ICD-10-CM | POA: Diagnosis not present

## 2019-08-19 DIAGNOSIS — R05 Cough: Secondary | ICD-10-CM | POA: Diagnosis not present

## 2019-08-19 DIAGNOSIS — Z20828 Contact with and (suspected) exposure to other viral communicable diseases: Secondary | ICD-10-CM | POA: Diagnosis not present

## 2019-08-19 DIAGNOSIS — R509 Fever, unspecified: Secondary | ICD-10-CM | POA: Diagnosis not present

## 2019-08-19 DIAGNOSIS — U071 COVID-19: Secondary | ICD-10-CM | POA: Diagnosis not present

## 2019-12-11 DIAGNOSIS — M549 Dorsalgia, unspecified: Secondary | ICD-10-CM | POA: Diagnosis not present

## 2020-01-17 DIAGNOSIS — R002 Palpitations: Secondary | ICD-10-CM | POA: Diagnosis not present

## 2020-10-24 DIAGNOSIS — R82998 Other abnormal findings in urine: Secondary | ICD-10-CM | POA: Diagnosis not present

## 2021-10-29 DIAGNOSIS — I10 Essential (primary) hypertension: Secondary | ICD-10-CM | POA: Diagnosis not present

## 2021-10-29 DIAGNOSIS — E785 Hyperlipidemia, unspecified: Secondary | ICD-10-CM | POA: Diagnosis not present

## 2021-10-29 DIAGNOSIS — R7301 Impaired fasting glucose: Secondary | ICD-10-CM | POA: Diagnosis not present

## 2021-10-29 DIAGNOSIS — Z125 Encounter for screening for malignant neoplasm of prostate: Secondary | ICD-10-CM | POA: Diagnosis not present

## 2021-11-05 DIAGNOSIS — Z0000 Encounter for general adult medical examination without abnormal findings: Secondary | ICD-10-CM | POA: Diagnosis not present

## 2021-11-05 DIAGNOSIS — I10 Essential (primary) hypertension: Secondary | ICD-10-CM | POA: Diagnosis not present

## 2021-11-05 DIAGNOSIS — R82998 Other abnormal findings in urine: Secondary | ICD-10-CM | POA: Diagnosis not present

## 2022-01-18 DIAGNOSIS — I10 Essential (primary) hypertension: Secondary | ICD-10-CM | POA: Diagnosis not present

## 2022-07-08 DIAGNOSIS — H6690 Otitis media, unspecified, unspecified ear: Secondary | ICD-10-CM | POA: Diagnosis not present

## 2022-07-17 DIAGNOSIS — H6002 Abscess of left external ear: Secondary | ICD-10-CM | POA: Diagnosis not present

## 2022-07-30 DIAGNOSIS — H6992 Unspecified Eustachian tube disorder, left ear: Secondary | ICD-10-CM | POA: Diagnosis not present

## 2022-07-30 DIAGNOSIS — H6002 Abscess of left external ear: Secondary | ICD-10-CM | POA: Diagnosis not present

## 2022-08-14 DIAGNOSIS — M654 Radial styloid tenosynovitis [de Quervain]: Secondary | ICD-10-CM | POA: Diagnosis not present

## 2022-08-14 DIAGNOSIS — M1812 Unilateral primary osteoarthritis of first carpometacarpal joint, left hand: Secondary | ICD-10-CM | POA: Diagnosis not present

## 2022-08-19 DIAGNOSIS — M654 Radial styloid tenosynovitis [de Quervain]: Secondary | ICD-10-CM | POA: Diagnosis not present

## 2022-11-04 DIAGNOSIS — K219 Gastro-esophageal reflux disease without esophagitis: Secondary | ICD-10-CM | POA: Diagnosis not present

## 2022-11-04 DIAGNOSIS — E785 Hyperlipidemia, unspecified: Secondary | ICD-10-CM | POA: Diagnosis not present

## 2022-11-04 DIAGNOSIS — R7989 Other specified abnormal findings of blood chemistry: Secondary | ICD-10-CM | POA: Diagnosis not present

## 2022-11-04 DIAGNOSIS — Z125 Encounter for screening for malignant neoplasm of prostate: Secondary | ICD-10-CM | POA: Diagnosis not present

## 2022-11-04 DIAGNOSIS — Z1212 Encounter for screening for malignant neoplasm of rectum: Secondary | ICD-10-CM | POA: Diagnosis not present

## 2022-11-04 DIAGNOSIS — I10 Essential (primary) hypertension: Secondary | ICD-10-CM | POA: Diagnosis not present

## 2022-11-04 DIAGNOSIS — R7301 Impaired fasting glucose: Secondary | ICD-10-CM | POA: Diagnosis not present

## 2022-11-11 ENCOUNTER — Other Ambulatory Visit (HOSPITAL_COMMUNITY): Payer: Self-pay | Admitting: Internal Medicine

## 2022-11-11 DIAGNOSIS — E785 Hyperlipidemia, unspecified: Secondary | ICD-10-CM | POA: Diagnosis not present

## 2022-11-11 DIAGNOSIS — J309 Allergic rhinitis, unspecified: Secondary | ICD-10-CM | POA: Diagnosis not present

## 2022-11-11 DIAGNOSIS — Z1331 Encounter for screening for depression: Secondary | ICD-10-CM | POA: Diagnosis not present

## 2022-11-11 DIAGNOSIS — I10 Essential (primary) hypertension: Secondary | ICD-10-CM | POA: Diagnosis not present

## 2022-11-11 DIAGNOSIS — R7301 Impaired fasting glucose: Secondary | ICD-10-CM | POA: Diagnosis not present

## 2022-11-11 DIAGNOSIS — Z1339 Encounter for screening examination for other mental health and behavioral disorders: Secondary | ICD-10-CM | POA: Diagnosis not present

## 2022-11-11 DIAGNOSIS — Z0000 Encounter for general adult medical examination without abnormal findings: Secondary | ICD-10-CM | POA: Diagnosis not present

## 2022-11-20 ENCOUNTER — Ambulatory Visit (HOSPITAL_COMMUNITY)
Admission: RE | Admit: 2022-11-20 | Discharge: 2022-11-20 | Disposition: A | Discharge status: Home / Self Care | Payer: BC Managed Care – PPO | Source: Ambulatory Visit | Attending: Internal Medicine | Admitting: Internal Medicine

## 2022-11-20 DIAGNOSIS — E785 Hyperlipidemia, unspecified: Secondary | ICD-10-CM | POA: Insufficient documentation

## 2023-03-18 DIAGNOSIS — R972 Elevated prostate specific antigen [PSA]: Secondary | ICD-10-CM | POA: Diagnosis not present

## 2023-03-18 DIAGNOSIS — R7301 Impaired fasting glucose: Secondary | ICD-10-CM | POA: Diagnosis not present

## 2023-03-18 DIAGNOSIS — I10 Essential (primary) hypertension: Secondary | ICD-10-CM | POA: Diagnosis not present

## 2023-03-18 DIAGNOSIS — Z23 Encounter for immunization: Secondary | ICD-10-CM | POA: Diagnosis not present

## 2023-07-18 DIAGNOSIS — R7301 Impaired fasting glucose: Secondary | ICD-10-CM | POA: Diagnosis not present

## 2023-07-18 DIAGNOSIS — I10 Essential (primary) hypertension: Secondary | ICD-10-CM | POA: Diagnosis not present

## 2023-10-28 ENCOUNTER — Emergency Department (HOSPITAL_BASED_OUTPATIENT_CLINIC_OR_DEPARTMENT_OTHER): Admission: EM | Admit: 2023-10-28 | Discharge: 2023-10-28 | Discharge status: Against Medical Advice

## 2023-10-28 ENCOUNTER — Encounter (HOSPITAL_BASED_OUTPATIENT_CLINIC_OR_DEPARTMENT_OTHER): Payer: Self-pay

## 2023-10-28 ENCOUNTER — Emergency Department (HOSPITAL_BASED_OUTPATIENT_CLINIC_OR_DEPARTMENT_OTHER)

## 2023-10-28 ENCOUNTER — Other Ambulatory Visit: Payer: Self-pay

## 2023-10-28 DIAGNOSIS — R61 Generalized hyperhidrosis: Secondary | ICD-10-CM | POA: Insufficient documentation

## 2023-10-28 DIAGNOSIS — R0602 Shortness of breath: Secondary | ICD-10-CM | POA: Insufficient documentation

## 2023-10-28 DIAGNOSIS — R0789 Other chest pain: Secondary | ICD-10-CM | POA: Insufficient documentation

## 2023-10-28 DIAGNOSIS — R42 Dizziness and giddiness: Secondary | ICD-10-CM | POA: Insufficient documentation

## 2023-10-28 DIAGNOSIS — Z5321 Procedure and treatment not carried out due to patient leaving prior to being seen by health care provider: Secondary | ICD-10-CM | POA: Insufficient documentation

## 2023-10-28 LAB — TROPONIN T, HIGH SENSITIVITY: Troponin T High Sensitivity: <15 ng/L (ref <19)

## 2023-10-28 LAB — CBC
HCT: 44.4 % (ref 39.0–52.0)
Hemoglobin: 15.5 g/dL (ref 13.0–17.0)
MCH: 30.2 pg (ref 26.0–34.0)
MCHC: 34.9 g/dL (ref 30.0–36.0)
MCV: 86.5 fL (ref 80.0–100.0)
Platelets: 184 10*3/uL (ref 150–400)
RBC: 5.13 MIL/uL (ref 4.22–5.81)
RDW: 12.7 % (ref 11.5–15.5)
WBC: 8.8 10*3/uL (ref 4.0–10.5)
nRBC: 0 % (ref 0.0–0.2)

## 2023-10-28 LAB — BASIC METABOLIC PANEL WITH GFR
Anion gap: 15 (ref 5–15)
BUN: 18 mg/dL (ref 6–20)
CO2: 23 mmol/L (ref 22–32)
Calcium: 9.6 mg/dL (ref 8.9–10.3)
Chloride: 101 mmol/L (ref 98–111)
Creatinine, Ser: 0.94 mg/dL (ref 0.61–1.24)
GFR, Estimated: >60 mL/min (ref >60)
Glucose, Bld: 85 mg/dL (ref 70–99)
Potassium: 3.3 mmol/L — ABNORMAL LOW (ref 3.5–5.1)
Sodium: 139 mmol/L (ref 135–145)

## 2023-10-28 NOTE — ED Notes (Signed)
 Patient came out of room and said "I feel better" and that "I had blood test 3 hours ago and nobody has come see me".  Patient then stated "I'm going to go, I feel better". Then walked out of the department.

## 2023-10-28 NOTE — ED Triage Notes (Signed)
 Pt states he was outside and suddenly became dizzy, light-headed and diaphoretic, felt like he was going to pass out. Had left sided chest pressure with radiation to left shoulder Denies nausea +SHOB Describes heaviness at present "no real pain"

## 2023-11-11 DIAGNOSIS — R972 Elevated prostate specific antigen [PSA]: Secondary | ICD-10-CM | POA: Diagnosis not present

## 2023-11-11 DIAGNOSIS — E785 Hyperlipidemia, unspecified: Secondary | ICD-10-CM | POA: Diagnosis not present

## 2023-11-11 DIAGNOSIS — R7301 Impaired fasting glucose: Secondary | ICD-10-CM | POA: Diagnosis not present

## 2023-11-18 DIAGNOSIS — Z1339 Encounter for screening examination for other mental health and behavioral disorders: Secondary | ICD-10-CM | POA: Diagnosis not present

## 2023-11-18 DIAGNOSIS — Z0000 Encounter for general adult medical examination without abnormal findings: Secondary | ICD-10-CM | POA: Diagnosis not present

## 2023-11-18 DIAGNOSIS — I10 Essential (primary) hypertension: Secondary | ICD-10-CM | POA: Diagnosis not present

## 2023-11-18 DIAGNOSIS — R82998 Other abnormal findings in urine: Secondary | ICD-10-CM | POA: Diagnosis not present

## 2023-11-18 DIAGNOSIS — Z1212 Encounter for screening for malignant neoplasm of rectum: Secondary | ICD-10-CM | POA: Diagnosis not present

## 2023-11-18 DIAGNOSIS — Z1331 Encounter for screening for depression: Secondary | ICD-10-CM | POA: Diagnosis not present

## 2024-05-06 DIAGNOSIS — I10 Essential (primary) hypertension: Secondary | ICD-10-CM | POA: Diagnosis not present

## 2024-05-06 DIAGNOSIS — R7301 Impaired fasting glucose: Secondary | ICD-10-CM | POA: Diagnosis not present

## 2024-05-21 ENCOUNTER — Encounter (INDEPENDENT_AMBULATORY_CARE_PROVIDER_SITE_OTHER): Payer: Self-pay

## 2024-06-09 ENCOUNTER — Encounter (INDEPENDENT_AMBULATORY_CARE_PROVIDER_SITE_OTHER): Payer: Self-pay | Admitting: Physician Assistant

## 2024-06-09 ENCOUNTER — Ambulatory Visit (INDEPENDENT_AMBULATORY_CARE_PROVIDER_SITE_OTHER): Admitting: Physician Assistant

## 2024-06-09 VITALS — BP 155/79 | HR 62 | Ht 67.0 in | Wt 180.0 lb

## 2024-06-09 DIAGNOSIS — Z1211 Encounter for screening for malignant neoplasm of colon: Secondary | ICD-10-CM | POA: Diagnosis not present

## 2024-06-09 DIAGNOSIS — H9311 Tinnitus, right ear: Secondary | ICD-10-CM | POA: Diagnosis not present

## 2024-06-09 NOTE — Progress Notes (Signed)
 Dear Dr. Janey, Here is my assessment for our mutual patient, Riley Rojas. Thank you for allowing me the opportunity to care for your patient. Please do not hesitate to contact me should you have any other questions. Sincerely, Chyrl Cohen PA-C  Otolaryngology Clinic Note Referring provider: Dr. Janey HPI:  Riley Rojas is a 58 y.o. male kindly referred by Dr. Janey   Discussed the use of AI scribe software for clinical note transcription with the patient, who gave verbal consent to proceed.  History of Present Illness    Riley Rojas is a 58 year old male who presents with right ear tinnitus for evaluation.  He has been experiencing a constant ringing in his right ear for approximately two and a half to three months. The sound is described as a 'steady tone' that sometimes stops and starts, resembling 'Morse code'. No previous episodes of tinnitus have been noted, and he has not observed any changes in his hearing, although his wife has noticed that he asks for the TV volume to be increased and says 'huh' more often than usual.  No pain in the ear, but he occasionally feels a sensation of fullness or being 'stopped up'. There is no history of ear infections, trauma, or exposure to loud environments. He has not started any new medications around the time the tinnitus began.  He has a history of seasonal allergies for which he takes allergy pills year-round, effectively controlling his symptoms.  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 There is no significant noise exposure at work. The tinnitus does not keep him up at night and is more noticeable in quiet environments.           Independent Review of Additional Tests or Records:  none   PMH/Meds/All/SocHx/FamHx/ROS:   Past Medical History:  Diagnosis Date   Hypercholesteremia    Hypertension      Past Surgical History:  Procedure Laterality Date    APPENDECTOMY     KNEE ARTHROSCOPY      Family History  Problem Relation Age of Onset   Heart disease Mother    Heart attack Mother 15     Social Connections: Unknown (11/04/2021)   Received from [facility]   Social Network    Social Network: Not on file     Current Medications[1]   Physical Exam:   BP (!) 155/79   Pulse 62   Ht 5' 7 (1.702 m)   Wt 180 lb (81.6 kg)   SpO2 96%   BMI 28.19 kg/m   Pertinent Findings  CN II-XII grossly intact Bilateral EAC clear and TM intact with well pneumatized middle ear spaces Weber 512: equal Rinne 512: AC > BC b/l  Anterior rhinoscopy: Septum midline; bilateral inferior turbinates with no hypertrophy No lesions of oral cavity/oropharynx; dentition within normal limits, No obviously palpable neck masses/lymphadenopathy/thyromegaly No respiratory distress or stridor   Seprately Identifiable Procedures:  None  Impression & Plans:  Riley Rojas is a 58 y.o. male with the following   Assessment and Plan    Tinnitus of right ear Chronic right ear tinnitus for 2.5 to 3 months. No pain, hearing loss, or noise exposure, no red flags.  - Ordered audiological evaluation  - Advised symptomatic management with white noise machines if bothersome.        - f/u Phone call with audio results    Thank you for allowing me the opportunity to care  for your patient. Please do not hesitate to contact me should you have any other questions.  Sincerely, Chyrl Cohen PA-C [facility] Phone: [phone] Fax: [phone]  06/09/2024, 4:10 PM        [1]  Current Outpatient Medications:    hydrochlorothiazide (MICROZIDE) 12.5 MG capsule, Take 12.5 mg by mouth daily., Disp: , Rfl:    loratadine (CLARITIN) 10 MG tablet, Take 10 mg by mouth daily., Disp: , Rfl:    metoprolol  succinate (TOPROL -XL) 50 MG 24 hr tablet, Take 1 tablet (50 mg total) by mouth daily. Please make overdue yearly appt with Dr. Jeffrie before  anymore refills. 2nd attempt, Disp: 15 tablet, Rfl: 0   Multiple Vitamin (MULTIVITAMIN WITH MINERALS) TABS tablet, Take 1 tablet by mouth daily., Disp: , Rfl:    ramipril (ALTACE) 10 MG capsule, Take 1 capsule by mouth daily., Disp: , Rfl:    simvastatin (ZOCOR) 20 MG tablet, Take 1 tablet by mouth daily., Disp: , Rfl:

## 2024-06-09 NOTE — Progress Notes (Signed)
 Patient having BP managed. Patient took BP medication this morning around 6.

## 2024-07-22 ENCOUNTER — Ambulatory Visit (INDEPENDENT_AMBULATORY_CARE_PROVIDER_SITE_OTHER): Admitting: Audiology

## 2024-07-22 DIAGNOSIS — H903 Sensorineural hearing loss, bilateral: Secondary | ICD-10-CM | POA: Diagnosis not present

## 2024-07-22 DIAGNOSIS — H9041 Sensorineural hearing loss, unilateral, right ear, with unrestricted hearing on the contralateral side: Secondary | ICD-10-CM

## 2024-07-22 NOTE — Progress Notes (Signed)
" °  [address] [place], [place] [address] [phone]  Audiological Evaluation    Name: Riley Rojas     DOB:   02-08-1966      MRN:   [id number]                                                                                     Service Date: 07/22/2024     Accompanied by: self    Patient comes today after Reyes Cohen, PA-C sent a referral for a hearing evaluation due to concerns with hearing loss.   Symptoms Yes Details  Hearing loss  [x]  Wife reports he asks for repetition more often  and that he turns up the TV louder  Tinnitus  [x]  Right ear tinnitus - high pitched tone with onset 3-4 months ago, unknown triggers or health changes, reports does not keep him up at night  Ear pain/ infections/pressure  []    Balance problems  []    Noise exposure history  []    Previous ear surgeries  []    Family history of hearing loss  []    Amplification  []    Other  []      Otoscopy: Right ear: clear external ear canal and notable landmarks visualized on the tympanic membrane. Left ear:  clear external ear canal and notable landmarks visualized on the tympanic membrane.  Tympanometry: Right ear: Type A - Normal external ear canal volume with normal middle ear pressure and normal tympanic membrane compliance. Findings are consistent with normal middle ear function. Left ear: Type A - Normal external ear canal volume with normal middle ear pressure and normal tympanic membrane compliance. Findings are consistent with normal middle ear function.   Hearing Evaluation The hearing test results were completed under headphones and results are deemed to be of good reliability. Test technique:  conventional    Pure tone Audiometry: Both ears- Normal to borderline normal hearing from 250 Hz - 8000 Hz., except for a mild presumably sensorineural hearing loss notch 6000 Hz in the right ear.   Speech Audiometry: Right ear- Speech Reception Threshold (SRT) was obtained at 10  dBHL. Left ear-Speech Reception Threshold (SRT) was obtained at 5 dBHL.   Word Recognition Score Tested using NU-6 (recorded) Right ear: 100% was obtained at a presentation level of 55 dBHL with contralateral masking which is deemed as  excellent. Left ear: 100% was obtained at a presentation level of 55 dBHL with contralateral masking which is deemed as  excellent.   Impression: There is not a significant difference in pure-tone thresholds between ears. There is not a significant difference in the word recognition score in between ears.    Recommendations: Follow up with ENT as scheduled. Return for a hearing evaluation if concerns with hearing changes arise or per MD recommendation. Consider various tinnitus strategies, including the use of a sound generator, hearing aids, and/or tinnitus retraining therapy.    Kurk Corniel MARIE LEROUX-MARTINEZ, AUD  "

## 2024-07-30 ENCOUNTER — Telehealth (INDEPENDENT_AMBULATORY_CARE_PROVIDER_SITE_OTHER): Payer: Self-pay

## 2024-07-30 NOTE — Telephone Encounter (Signed)
 Called patient and left voicemail for patient to give us  a call back regarding results.
